# Patient Record
Sex: Female | Born: 1955
Health system: Midwestern US, Community
[De-identification: ages and names within clinical notes are randomized; demographics above are authoritative.]

## PROBLEM LIST (undated history)

## (undated) DIAGNOSIS — K589 Irritable bowel syndrome without diarrhea: Secondary | ICD-10-CM

## (undated) DIAGNOSIS — F419 Anxiety disorder, unspecified: Secondary | ICD-10-CM

## (undated) DIAGNOSIS — K219 Gastro-esophageal reflux disease without esophagitis: Secondary | ICD-10-CM

## (undated) DIAGNOSIS — E119 Type 2 diabetes mellitus without complications: Secondary | ICD-10-CM

## (undated) DIAGNOSIS — E079 Disorder of thyroid, unspecified: Secondary | ICD-10-CM

---

## 2020-10-21 ENCOUNTER — Encounter (HOSPITAL_COMMUNITY): Payer: Self-pay | Admitting: Emergency Medicine

## 2020-10-21 ENCOUNTER — Emergency Department (HOSPITAL_COMMUNITY): Payer: Medicare Other

## 2020-10-21 ENCOUNTER — Emergency Department (HOSPITAL_COMMUNITY)
Admission: EM | Admit: 2020-10-21 | Discharge: 2020-10-21 | Disposition: A | Discharge status: Home / Self Care | Payer: Medicare Other | Attending: Emergency Medicine | Admitting: Emergency Medicine

## 2020-10-21 ENCOUNTER — Other Ambulatory Visit: Payer: Self-pay

## 2020-10-21 DIAGNOSIS — E119 Type 2 diabetes mellitus without complications: Secondary | ICD-10-CM | POA: Diagnosis not present

## 2020-10-21 DIAGNOSIS — M791 Myalgia, unspecified site: Secondary | ICD-10-CM | POA: Insufficient documentation

## 2020-10-21 DIAGNOSIS — Y92410 Unspecified street and highway as the place of occurrence of the external cause: Secondary | ICD-10-CM | POA: Insufficient documentation

## 2020-10-21 DIAGNOSIS — F17200 Nicotine dependence, unspecified, uncomplicated: Secondary | ICD-10-CM | POA: Diagnosis not present

## 2020-10-21 DIAGNOSIS — M549 Dorsalgia, unspecified: Secondary | ICD-10-CM | POA: Insufficient documentation

## 2020-10-21 DIAGNOSIS — R519 Headache, unspecified: Secondary | ICD-10-CM | POA: Insufficient documentation

## 2020-10-21 DIAGNOSIS — M542 Cervicalgia: Secondary | ICD-10-CM | POA: Insufficient documentation

## 2020-10-21 HISTORY — DX: Type 2 diabetes mellitus without complications: E11.9

## 2020-10-21 HISTORY — DX: Irritable bowel syndrome, unspecified: K58.9

## 2020-10-21 HISTORY — DX: Anxiety disorder, unspecified: F41.9

## 2020-10-21 HISTORY — DX: Disorder of thyroid, unspecified: E07.9

## 2020-10-21 HISTORY — DX: Gastro-esophageal reflux disease without esophagitis: K21.9

## 2020-10-21 NOTE — ED Notes (Signed)
E-signature pad unavailable at time of pt discharge. This RN discussed discharge materials with pt and answered all pt questions. Pt stated understanding of discharge material. ? ?

## 2020-10-21 NOTE — Discharge Instructions (Addendum)
You can take 600 mg of ibuprofen every 6 hours, you can take 1000 mg of Tylenol every 6 hours, you can alternate these every 3 or you can take them together.  In addition to those pain medications hydrate well and take your muscle relaxers you have your chronic medical needs.  Return to [place] with any changes in your pathology.
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
## 2022-02-16 NOTE — Telephone Encounter (Signed)
-  Patient returning a missed call, no VM. Caller denies concerns for triage needs. RN notified caller she may receive a call back by 10am on the next business day if further information is needed from her.     -She admits to having a scheduled appointment on Tuesday and assumes the missed call was an appointment reminder.
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
